# Patient Record
Sex: Female | Born: 1970 | Race: Black or African American | Hispanic: No | Marital: Married | Smoking: Current every day smoker
Health system: Southern US, Community
[De-identification: ages and names within clinical notes are randomized; demographics above are authoritative.]

## PROBLEM LIST (undated history)

## (undated) DIAGNOSIS — I10 Essential (primary) hypertension: Secondary | ICD-10-CM

---

## 2018-03-09 ENCOUNTER — Encounter (HOSPITAL_COMMUNITY): Payer: Self-pay | Admitting: *Deleted

## 2018-03-09 ENCOUNTER — Ambulatory Visit (HOSPITAL_COMMUNITY)
Admission: EM | Admit: 2018-03-09 | Discharge: 2018-03-09 | Disposition: A | Discharge status: Home / Self Care | Payer: Managed Care, Other (non HMO) | Attending: Internal Medicine | Admitting: Internal Medicine

## 2018-03-09 DIAGNOSIS — M62830 Muscle spasm of back: Secondary | ICD-10-CM

## 2018-03-09 DIAGNOSIS — I10 Essential (primary) hypertension: Secondary | ICD-10-CM

## 2018-03-09 HISTORY — DX: Essential (primary) hypertension: I10

## 2018-03-09 MED ORDER — CYCLOBENZAPRINE HCL 10 MG PO TABS: 10.0000 mg | ORAL_TABLET | Freq: Two times a day (BID) | ORAL | 0 refills | Status: DC | PRN

## 2018-03-09 MED ORDER — NAPROXEN 375 MG PO TABS: 375.0000 mg | ORAL_TABLET | Freq: Two times a day (BID) | ORAL | 0 refills | Status: DC | PRN

## 2018-03-09 NOTE — ED Provider Notes (Addendum)
[facility]    CSN: [id number] Arrival date & time: 03/09/18  1422     History   Chief Complaint Chief Complaint  Patient presents with  . Back Pain  . Abdominal Pain    HPI Alison Hopkins is a 47 y.o. female.   Complains of mid back pain that started three days ago.  It began after reaching over her seat in the car forward.  She localizes the pain to the left mid back and reports radiating symptoms towards left side.  She describes the pain as constant and cramping.  She has tried OTC medications including ibuprofen and goody powder without relief.  Her symptoms are made worse with bending forward and reaching activities.  She denies hx of similar symptoms in the past.    Negative family hx of heart disease, HTN or vascular dx Patient reports hx of HTN Smoker <0.5 PPD <20 years Negative hx of high cholesterol, or vascular disease     Past Medical History:  Diagnosis Date  . Hypertension     There are no active problems to display for this patient.   History reviewed. No pertinent surgical history.  OB History   None      Home Medications    Prior to Admission medications   Medication Sig Start Date End Date Taking? Authorizing Provider  LISINOPRIL-HYDROCHLOROTHIAZIDE PO Take by mouth.   Yes [provider]  cyclobenzaprine (FLEXERIL) 10 MG tablet Take 1 tablet (10 mg total) by mouth 2 (two) times daily as needed for muscle spasms. 03/09/18   Otniel Hoe, [place], PA-C  naproxen (NAPROSYN) 375 MG tablet Take 1 tablet (375 mg total) by mouth 2 (two) times daily as needed for moderate pain. 03/09/18   Rennis Harding, PA-C    Family History Family History  Problem Relation Age of Onset  . Healthy Mother   . Healthy Father     Social History Social History   Tobacco Use  . Smoking status: Current Every Day Smoker    Types: Cigarettes  . Smokeless tobacco: Never Used  Substance Use Topics  . Alcohol use: Yes    Comment: occasionally  .  Drug use: Never     Allergies   Patient has no known allergies.   Review of Systems Review of Systems  Constitutional: Negative for chills, fatigue and fever.  Respiratory: Negative for shortness of breath.   Cardiovascular: Negative for chest pain.  Gastrointestinal: Negative for abdominal pain, constipation, diarrhea, nausea and vomiting.  Genitourinary: Negative for difficulty urinating, dysuria, flank pain, frequency, hematuria and urgency.  Musculoskeletal: Positive for back pain and myalgias. Negative for gait problem.  Neurological: Negative for weakness.     Physical Exam Triage Vital Signs ED Triage Vitals  Enc Vitals Group     BP 03/09/18 1547 (!) 154/94     Pulse Rate 03/09/18 1547 81     Resp 03/09/18 1547 16     Temp 03/09/18 1547 (!) 97.5 F (36.4 C)     Temp src --      SpO2 03/09/18 1547 100 %     Weight --      Height --      Head Circumference --      Peak Flow --      Pain Score 03/09/18 1548 9     Pain Loc --      Pain Edu? --      Excl. in GC? --    No data found.  Updated Vital  Signs BP (!) 154/94   Pulse 81   Temp (!) 97.5 F (36.4 C)   Resp 16   LMP 02/16/2018 (Approximate)   SpO2 100%    Physical Exam  Constitutional: She is oriented to person, place, and time. She appears well-developed and well-nourished.  Non-toxic appearance. She does not appear ill. No distress.  HENT:  Head: Normocephalic and atraumatic.  Mouth/Throat: Oropharynx is clear and moist. No oropharyngeal exudate.  Eyes: Pupils are equal, round, and reactive to light. EOM are normal. No scleral icterus.  Neck: Normal range of motion.  Cardiovascular: Normal rate, regular rhythm, normal heart sounds and intact distal pulses. Exam reveals no gallop.  No murmur heard. 2+ radial and posterior tibialis pulse bilaterally and symmetrical  Pulmonary/Chest: Effort normal and breath sounds normal. She has no wheezes. She has no rhonchi. She has no rales.  Abdominal: Soft.  Normal appearance, normal aorta and bowel sounds are normal. She exhibits no pulsatile midline mass. There is no guarding and no CVA tenderness.  Musculoskeletal: She exhibits tenderness.  Back: Inspection: Skin clear and intact with obvious erythema, effusion, or ecchymosis.  Skin warm and dry to the touch Palpation: Nontender to palpation over the vertebral processes.  Mild tenderness of the left paravertebral muscles of the thoracic spine ROM: FROM active and passive Strength: 5/5 elbow extension, 5/5 grip strength, 5/5 hip flexion, 5/5 knee abduction, 5/5 knee adduction, 5/5 knee flexion, 5/5 knee extension, 5/5 dorsiflexion, 5/5 plantar flexion Sensation intact about the upper and lower extremities  Lymphadenopathy:    She has no cervical adenopathy.  Neurological: She is alert and oriented to person, place, and time. She displays normal reflexes (2+ patellar reflex). Coordination (Rapid alternating movements without diffculty) normal.  Skin: Skin is warm and dry. Capillary refill takes less than 2 seconds.  Psychiatric: She has a normal mood and affect. Her behavior is normal. Judgment and thought content normal.  Vitals reviewed.    UC Treatments / Results  Labs (all labs ordered are listed, but only abnormal results are displayed) Labs Reviewed - No data to display  EKG None Radiology No results found.  Procedures Procedures (including critical care time)  Medications Ordered in UC Medications - No data to display   Initial Impression / Assessment and Plan / UC Course  I have reviewed the triage vital signs and the nursing notes.  Pertinent labs & imaging results that were available during my care of the patient were reviewed by me and considered in my medical decision making (see chart for details).    Three day hx of back pain that began after reaching motion.  Based on hx and PE symptoms most consistent with muscle spasm.  Will perform gentle stretches, rest, ice  and heat as directed.  Prescribed naproxen and cyclobenzaprine as needed for pain and muscle spasm.  Will follow up with PCP if symptoms persists.  Return and ER precautions given.     Final Clinical Impressions(s) / UC Diagnoses   Final diagnoses:  Muscle spasm of back  Essential hypertension, benign    ED Discharge Orders        Ordered    naproxen (NAPROSYN) 375 MG tablet  2 times daily PRN     03/09/18 1620    cyclobenzaprine (FLEXERIL) 10 MG tablet  2 times daily PRN     03/09/18 1620       Controlled Substance Prescriptions [place] Controlled Substance Registry consulted? No   Rennis Harding, [place] 03/09/18 1630  Rennis Harding, PA-C 03/09/18 1631

## 2018-03-09 NOTE — Discharge Instructions (Addendum)
Rest, ice, heat and gentle stretches Prescribed naproxen take as needed for symptomatic relief Prescribed flexeril as needed for muscle spasm (do not drive or operate heavy machinery while taking this medication, it may make you drowsy) Follow up with PCP if symptoms persists Return here or go to ER if you have any new or worsening symptoms  Take blood pressure medication as directed Follow up with PCP if blood pressure continues to remain elevated

## 2018-03-09 NOTE — ED Triage Notes (Signed)
Pt reports reaching over her seat in the care when sudden onset left mid-back pain wrapping around to epigastric region.  Continues with significant pain.

## 2018-04-08 ENCOUNTER — Emergency Department (HOSPITAL_COMMUNITY)
Admission: EM | Admit: 2018-04-08 | Discharge: 2018-04-08 | Disposition: A | Discharge status: Home / Self Care | Payer: 59 | Attending: Emergency Medicine | Admitting: Emergency Medicine

## 2018-04-08 ENCOUNTER — Emergency Department (HOSPITAL_COMMUNITY): Payer: 59

## 2018-04-08 DIAGNOSIS — Z5321 Procedure and treatment not carried out due to patient leaving prior to being seen by health care provider: Secondary | ICD-10-CM | POA: Diagnosis not present

## 2018-04-08 DIAGNOSIS — R0789 Other chest pain: Secondary | ICD-10-CM | POA: Insufficient documentation

## 2018-04-08 LAB — BASIC METABOLIC PANEL
Anion gap: 8 (ref 5–15)
BUN: 9 mg/dL (ref 6–20)
CALCIUM: 9.5 mg/dL (ref 8.9–10.3)
CO2: 26 mmol/L (ref 22–32)
CREATININE: 0.69 mg/dL (ref 0.44–1.00)
Chloride: 104 mmol/L (ref 101–111)
GFR calc Af Amer: >60 mL/min (ref >60)
GFR calc non Af Amer: >60 mL/min (ref >60)
Glucose, Bld: 109 mg/dL — ABNORMAL HIGH (ref 65–99)
Potassium: 4.1 mmol/L (ref 3.5–5.1)
SODIUM: 138 mmol/L (ref 135–145)

## 2018-04-08 LAB — CBC
HCT: 37.8 % (ref 36.0–46.0)
Hemoglobin: 11.7 g/dL — ABNORMAL LOW (ref 12.0–15.0)
MCH: 28.3 pg (ref 26.0–34.0)
MCHC: 31 g/dL (ref 30.0–36.0)
MCV: 91.3 fL (ref 78.0–100.0)
PLATELETS: 429 10*3/uL — AB (ref 150–400)
RBC: 4.14 MIL/uL (ref 3.87–5.11)
RDW: 12.2 % (ref 11.5–15.5)
WBC: 4.7 10*3/uL (ref 4.0–10.5)

## 2018-04-08 LAB — I-STAT TROPONIN, ED: TROPONIN I, POC: 0 ng/mL (ref 0.00–0.08)

## 2018-04-08 LAB — I-STAT BETA HCG BLOOD, ED (MC, WL, AP ONLY): I-stat hCG, quantitative: <5 m[IU]/mL (ref <5)

## 2018-04-08 NOTE — ED Notes (Signed)
Patient noted to be walking up stairs outside of front entrance while this RN was assisting another patient into their vehicle, stating "let's go, I'm not waiting here this long for that." Attempted to stop patient and talk to them, but patient had already walked off. Ambulatory with steady gait, no apparent distress noted.

## 2018-04-09 NOTE — ED Notes (Signed)
Follow up call made  Pt states she is returning to ed when she gets off work  1225  04/09/18  s Leodan Bolyard rn

## 2018-07-22 ENCOUNTER — Encounter (HOSPITAL_COMMUNITY): Payer: Self-pay | Admitting: Emergency Medicine

## 2018-07-22 ENCOUNTER — Emergency Department (HOSPITAL_COMMUNITY): Admission: EM | Admit: 2018-07-22 | Payer: 59 | Source: Home / Self Care

## 2018-07-22 ENCOUNTER — Ambulatory Visit (HOSPITAL_COMMUNITY)
Admission: EM | Admit: 2018-07-22 | Discharge: 2018-07-22 | Disposition: A | Discharge status: Home / Self Care | Payer: Managed Care, Other (non HMO) | Attending: Family Medicine | Admitting: Family Medicine

## 2018-07-22 ENCOUNTER — Other Ambulatory Visit: Payer: Self-pay

## 2018-07-22 DIAGNOSIS — R1032 Left lower quadrant pain: Secondary | ICD-10-CM

## 2018-07-22 DIAGNOSIS — R109 Unspecified abdominal pain: Secondary | ICD-10-CM

## 2018-07-22 LAB — POCT PREGNANCY, URINE: PREG TEST UR: NEGATIVE

## 2018-07-22 LAB — POCT URINALYSIS DIP (DEVICE)
Bilirubin Urine: NEGATIVE
GLUCOSE, UA: NEGATIVE mg/dL
Hgb urine dipstick: NEGATIVE
Nitrite: NEGATIVE
PROTEIN: NEGATIVE mg/dL
Specific Gravity, Urine: >=1.03 (ref 1.005–1.030)
UROBILINOGEN UA: 1 mg/dL (ref 0.0–1.0)
pH: 6 (ref 5.0–8.0)

## 2018-07-22 MED ORDER — MELOXICAM 7.5 MG PO TABS: 7.5000 mg | ORAL_TABLET | Freq: Every day | ORAL | 0 refills | Status: DC

## 2018-07-22 NOTE — ED Triage Notes (Signed)
Pt c/o pelvic pain x2 days, [facility] and has been taking antibiotics for UTi but its still hurting.

## 2018-07-22 NOTE — ED Provider Notes (Signed)
[facility]   [id number] 07/22/18 Arrival Time: 1800  CC: Left groin and low back pain  SUBJECTIVE:  Alison Hopkins is a 47 y.o. female who presents with complaint of worsening left groin and back pain that started 4 days ago.  Was seen at [facility] 3 days ago and treated with antibiotic for UTI.  Denies a precipitating event, or trauma.  Localizes pain to left groin and back.  Describes as intermittent and throbbing in character.  Has tried OTC medications like aleve and ibuprofen with relief.  Is compliant with antibiotic, but denies relief in her symptoms.  Improved with laying on right side. Denies similar symptoms in the past.  Last BM this morning and normal for pain.  Complains of associated urinary frequency.     Denies fever, chills, appetite changes, weight changes, nausea, vomiting, chest pain, SOB, diarrhea, constipation, hematochezia, melena, dysuria, difficulty urinating, increased urgency, flank pain, loss of bowel or bladder function, saddle paresthesia, vaginal pain, vaginal bleeding.    Patient's last menstrual period was 07/02/2018.  ROS: As per HPI.  Past Medical History:  Diagnosis Date  . Hypertension    History reviewed. No pertinent surgical history. No Known Allergies No current facility-administered medications on file prior to encounter.    Current Outpatient Medications on File Prior to Encounter  Medication Sig Dispense Refill  . Sulfamethoxazole-Trimethoprim (BACTRIM PO) Take by mouth.    . cyclobenzaprine (FLEXERIL) 10 MG tablet Take 1 tablet (10 mg total) by mouth 2 (two) times daily as needed for muscle spasms. 20 tablet 0  . LISINOPRIL-HYDROCHLOROTHIAZIDE PO Take by mouth.    . sulfamethoxazole-trimethoprim (BACTRIM DS,SEPTRA DS) 800-160 MG tablet      Social History   Socioeconomic History  . Marital status: Married    Spouse name: Not on file  . Number of children: Not on file  . Years of education: Not on file  . Highest education  level: Not on file  Occupational History  . Not on file  Social Needs  . Financial resource strain: Not on file  . Food insecurity:    Worry: Not on file    Inability: Not on file  . Transportation needs:    Medical: Not on file    Non-medical: Not on file  Tobacco Use  . Smoking status: Current Every Day Smoker    Types: Cigarettes  . Smokeless tobacco: Never Used  Substance and Sexual Activity  . Alcohol use: Yes    Comment: occasionally  . Drug use: Never  . Sexual activity: Not on file  Lifestyle  . Physical activity:    Days per week: Not on file    Minutes per session: Not on file  . Stress: Not on file  Relationships  . Social connections:    Talks on phone: Not on file    Gets together: Not on file    Attends religious service: Not on file    Active member of club or organization: Not on file    Attends meetings of clubs or organizations: Not on file    Relationship status: Not on file  . Intimate partner violence:    Fear of current or ex partner: Not on file    Emotionally abused: Not on file    Physically abused: Not on file    Forced sexual activity: Not on file  Other Topics Concern  . Not on file  Social History Narrative  . Not on file   Family History  Problem Relation  Age of Onset  . Healthy Mother   . Healthy Father      OBJECTIVE:  Vitals:   07/22/18 1842  BP: (!) 140/92  Pulse: 70  Resp: 16  Temp: 98.1 F (36.7 C)  SpO2: 100%    General appearance: AOx3 in no acute distress; nontoxic appearance HEENT: NCAT.  Oropharynx clear.  Lungs: clear to auscultation bilaterally without adventitious breath sounds Heart: regular rate and rhythm.  Radial pulses 2+ symmetrical bilaterally Abdomen: soft, non-distended; normal active bowel sounds; non-tender to light and deep palpation; no guarding Back: no CVA tenderness; no midline tenderness or palpable muscle spasm; unable to reproduce symptoms on exam Extremities: no edema; symmetrical with  no gross deformities Skin: warm and dry Neurologic: normal gait Psychological: alert and cooperative; normal mood and affect  Labs: Results for orders placed or performed during the hospital encounter of 07/22/18 (from the past 24 hour(s))  POCT urinalysis dip (device)     Status: Abnormal   Collection Time: 07/22/18  7:02 PM  Result Value Ref Range   Glucose, UA NEGATIVE NEGATIVE mg/dL   Bilirubin Urine NEGATIVE NEGATIVE   Ketones, ur TRACE (A) NEGATIVE mg/dL   Specific Gravity, Urine >=1.030 1.005 - 1.030   Hgb urine dipstick NEGATIVE NEGATIVE   pH 6.0 5.0 - 8.0   Protein, ur NEGATIVE NEGATIVE mg/dL   Urobilinogen, UA 1.0 0.0 - 1.0 mg/dL   Nitrite NEGATIVE NEGATIVE   Leukocytes, UA SMALL (A) NEGATIVE  Pregnancy, urine POC     Status: None   Collection Time: 07/22/18  7:12 PM  Result Value Ref Range   Preg Test, Ur NEGATIVE NEGATIVE   ASSESSMENT & PLAN:  1. Acute left flank pain   2. Left groin pain     Meds ordered this encounter  Medications  . meloxicam (MOBIC) 7.5 MG tablet    Sig: Take 1 tablet (7.5 mg total) by mouth daily.    Dispense:  20 tablet    Refill:  0    Order Specific Question:   Supervising Provider    Answer:   Isa Rankin [[id number]]   Continue with antibiotic Symptoms could be related to kidney stones Drink plenty of fluids and get rest Mobic prescribed.  Take as directed and to completion.  Do not take with other NSAIDs as this can lead to stomach upset, ulcer formation, and GI bleed Follow up with PCP if symptoms persist Return or go to ER if you have any new or worsening symptoms (difficulty urinating, blood in urine, pain that does not moderate with medication, fever, chills, abdominal pain, etc...)  Reviewed expectations re: course of current medical issues. Questions answered. Outlined signs and symptoms indicating need for more acute intervention. Patient verbalized understanding. After Visit Summary given.   Rennis Harding,  PA-C 07/22/18 2012

## 2018-07-22 NOTE — Discharge Instructions (Addendum)
Symptoms could be related to kidney stones Drink plenty of fluids and get rest Mobic prescribed.  Take as directed and to completion.  Do not take with other NSAIDs as this can lead to stomach upset, ulcer formation, and GI bleed Follow up with PCP if symptoms persist Return or go to ER if you have any new or worsening symptoms (difficulty urinating, blood in urine, pain that does not moderate with medication, fever, chills, abdominal pain, etc...)

## 2019-11-14 IMAGING — DX DG CHEST 2V
2 series · 2 of 2 positions shown · non-contrast
Comparison: None.

CLINICAL DATA: 46 y/o  F; chest pain.

EXAM:
CHEST - 2 VIEW

[w chest pa]
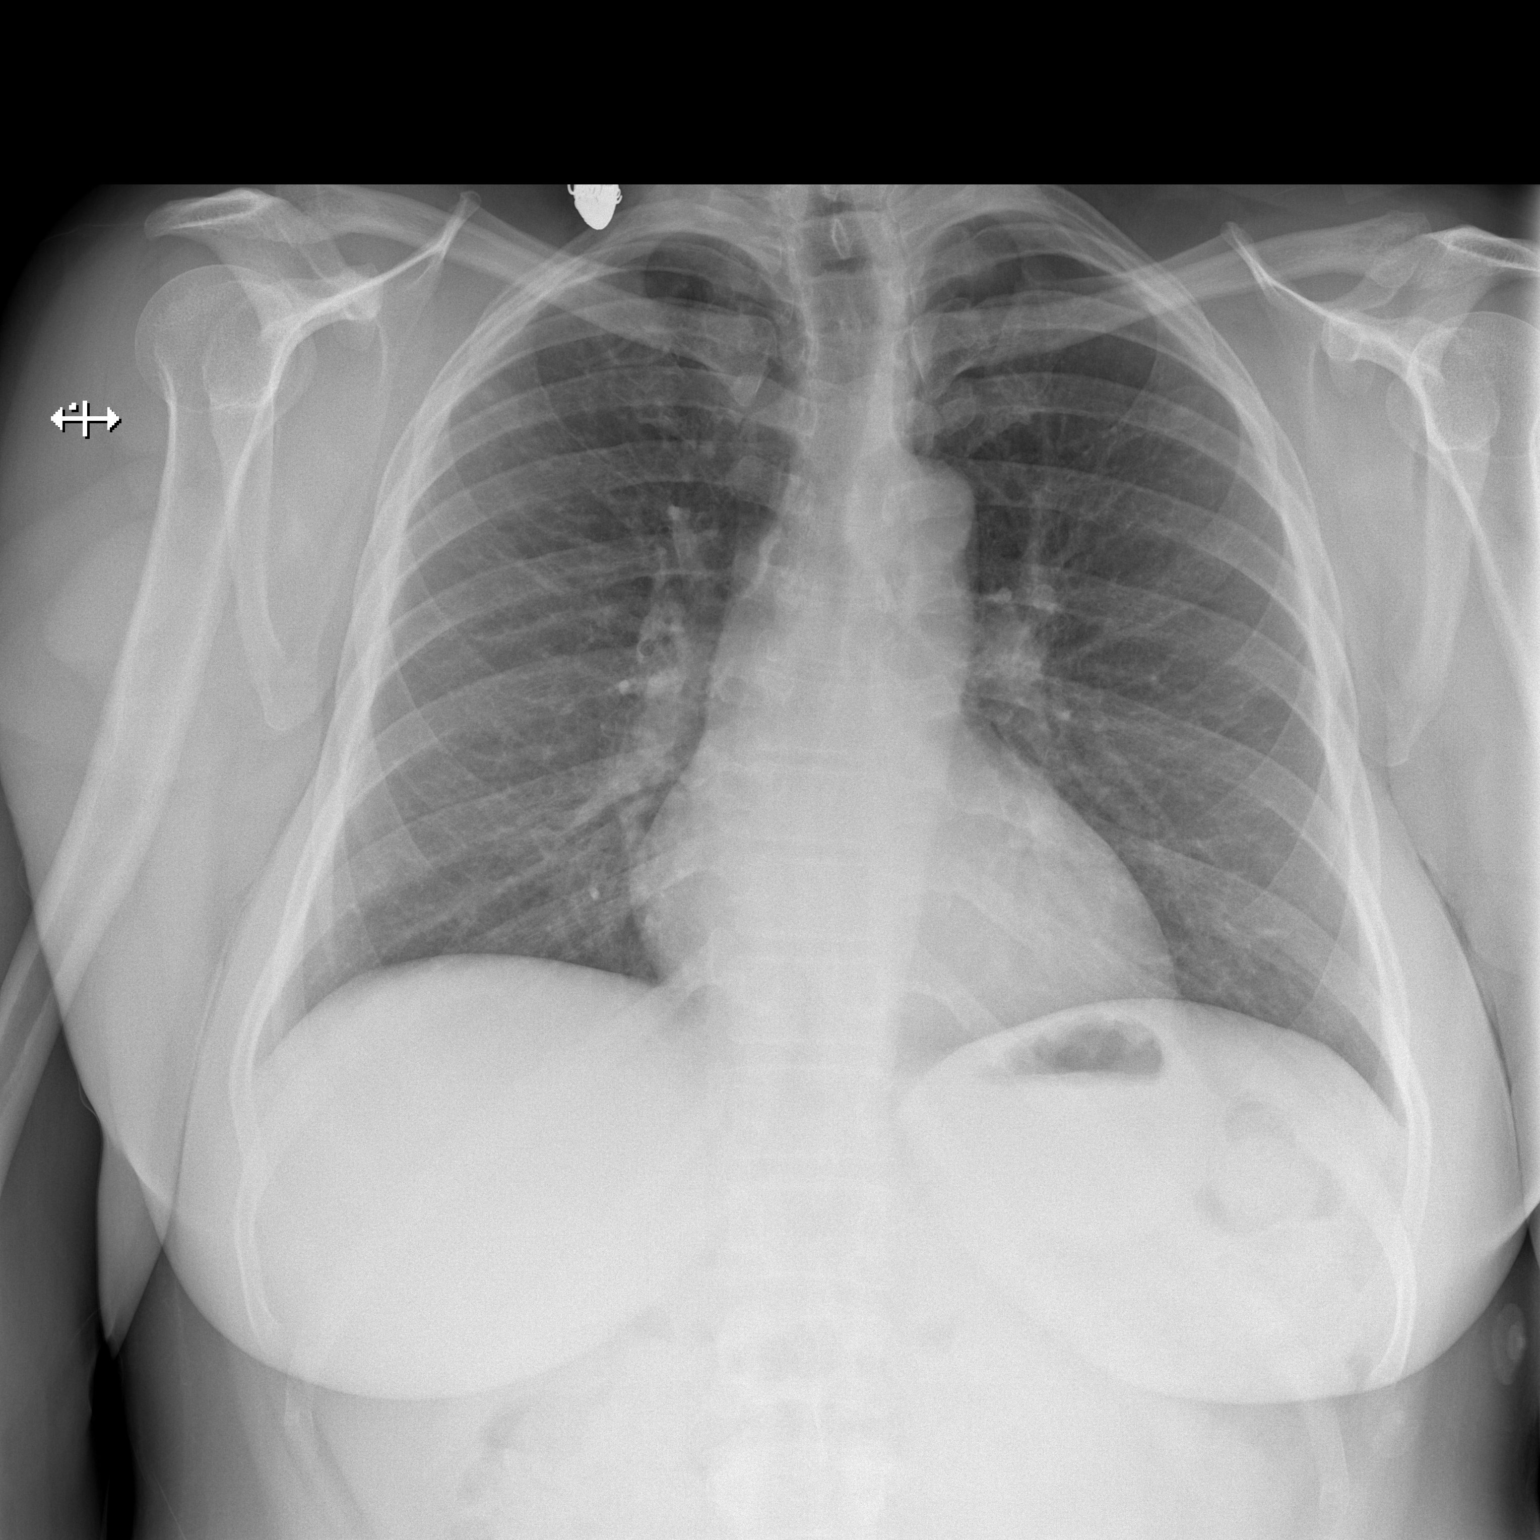

[w chest lat]
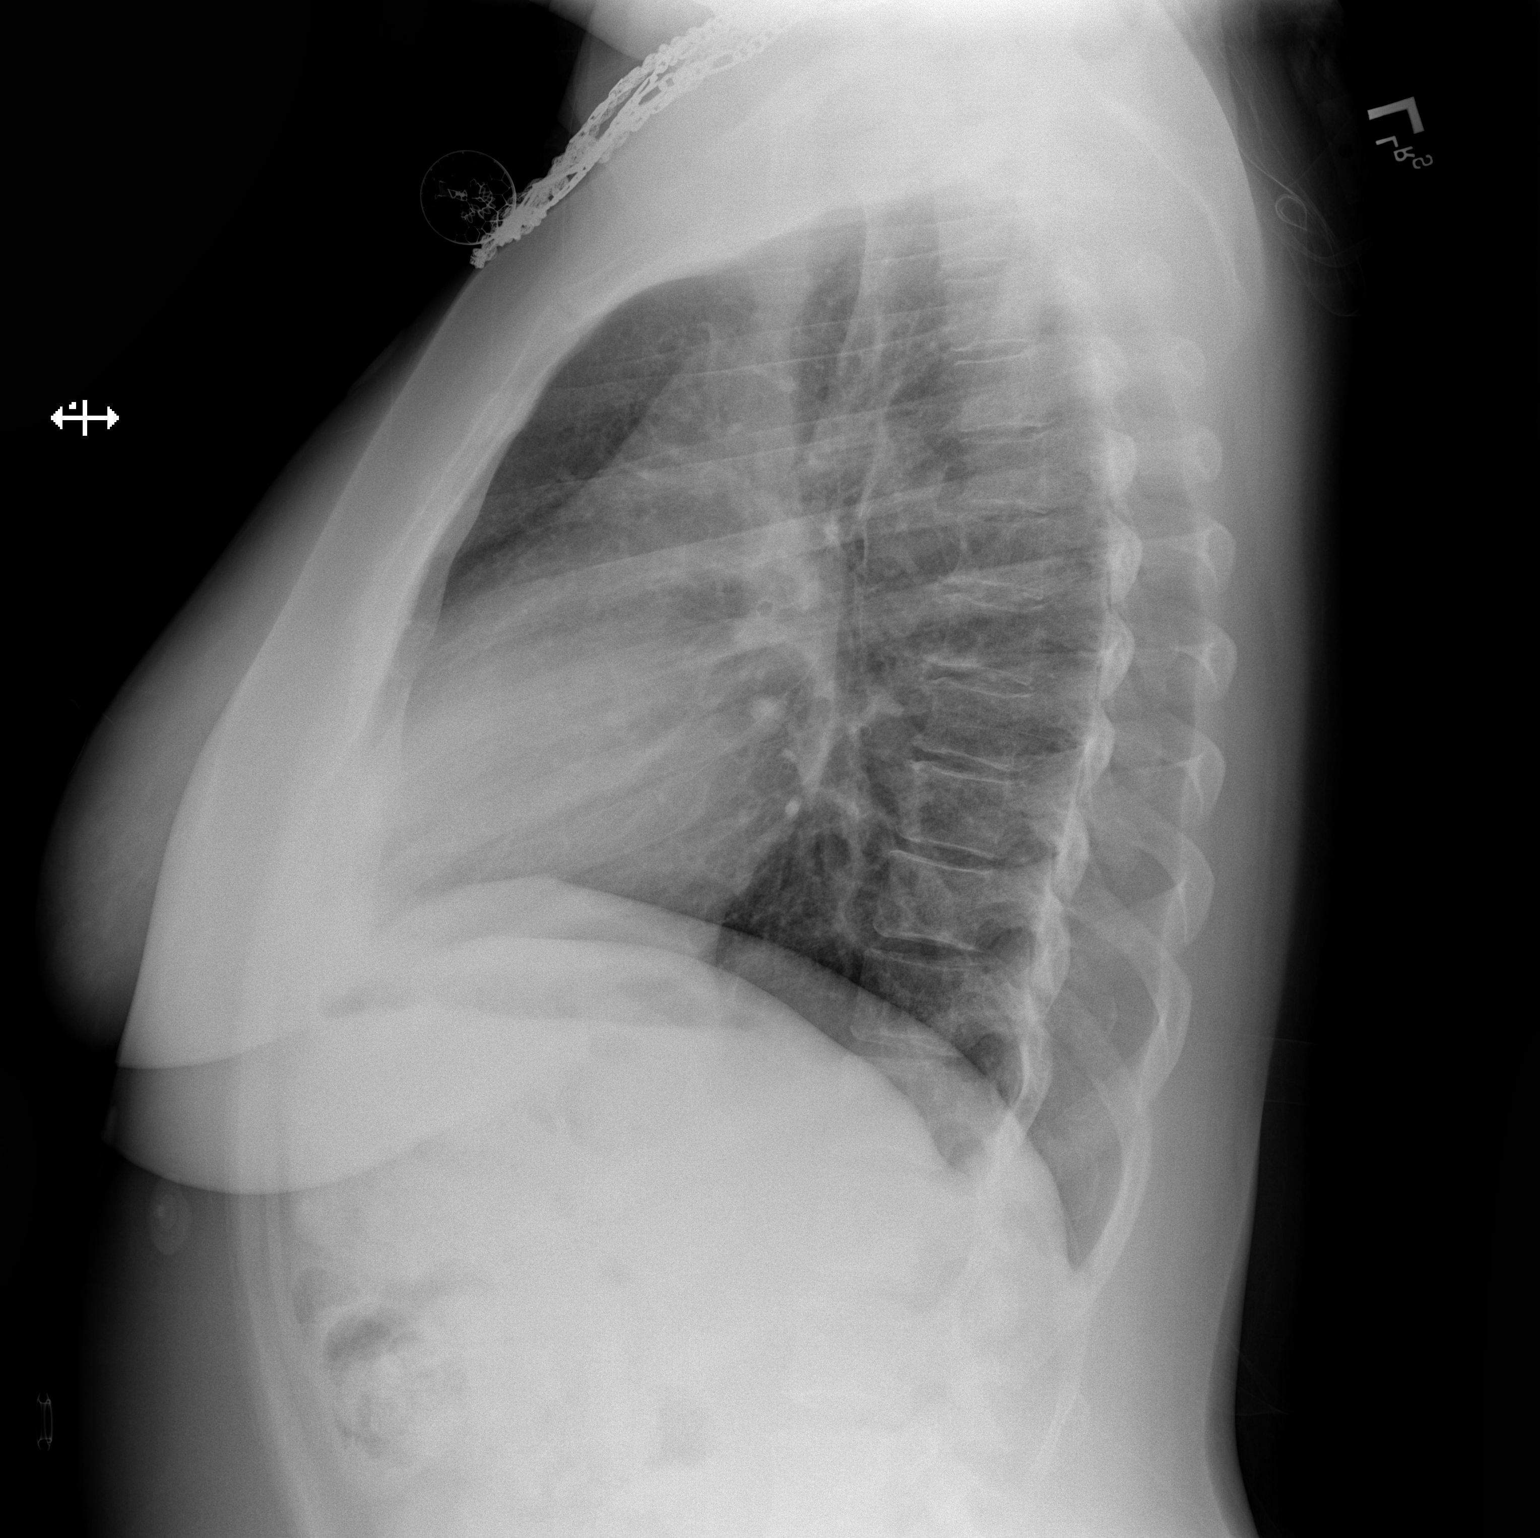

[2 of 2 positions shown; findings below may reference images not displayed]

FINDINGS: The heart size and mediastinal contours are within normal limits.
Both lungs are clear. The visualized skeletal structures are
unremarkable.
IMPRESSION: No active cardiopulmonary disease.

By: Dorisana Une M.D.

## 2023-01-23 ENCOUNTER — Ambulatory Visit (INDEPENDENT_AMBULATORY_CARE_PROVIDER_SITE_OTHER): Payer: 59

## 2023-01-23 ENCOUNTER — Encounter (HOSPITAL_COMMUNITY): Payer: Self-pay

## 2023-01-23 ENCOUNTER — Ambulatory Visit (HOSPITAL_COMMUNITY)
Admission: EM | Admit: 2023-01-23 | Discharge: 2023-01-23 | Disposition: A | Discharge status: Home / Self Care | Payer: 59 | Attending: Family Medicine | Admitting: Family Medicine

## 2023-01-23 DIAGNOSIS — M25571 Pain in right ankle and joints of right foot: Secondary | ICD-10-CM

## 2023-01-23 DIAGNOSIS — W19XXXA Unspecified fall, initial encounter: Secondary | ICD-10-CM

## 2023-01-23 DIAGNOSIS — M79671 Pain in right foot: Secondary | ICD-10-CM

## 2023-01-23 MED ORDER — NAPROXEN 500 MG PO TABS: 500.0000 mg | ORAL_TABLET | Freq: Two times a day (BID) | ORAL | 0 refills | Status: AC

## 2023-01-23 MED ORDER — HYDROCODONE-ACETAMINOPHEN 5-325 MG PO TABS: 1.0000 | ORAL_TABLET | Freq: Four times a day (QID) | ORAL | 0 refills | Status: AC | PRN

## 2023-01-23 NOTE — Discharge Instructions (Signed)
Be aware, you have been prescribed pain medications that may cause drowsiness. While taking this medication, do not take any other medications containing acetaminophen (Tylenol). Do not combine with alcohol or or recreational drugs. Please do not drive, operate heavy machinery, or take part in activities that require making important decisions while on this medication as your judgement may be clouded.

## 2023-01-23 NOTE — ED Triage Notes (Signed)
Pt fell today and injured right ankle. Pt did not take anything for the pain today.

## 2023-01-24 NOTE — ED Provider Notes (Signed)
[place]   [id number] 01/23/23 Arrival Time: 1923  ASSESSMENT & PLAN:  1. Right foot pain   2. Acute right ankle pain    I have personally viewed and independently interpreted the imaging studies ordered this visit. R ankle and R foot: no appreciable acute bony abnormalities.  Discharge Medication List as of 01/23/2023  8:32 PM     START taking these medications   Details  HYDROcodone-acetaminophen (NORCO/VICODIN) 5-325 MG tablet Take 1 tablet by mouth every 6 (six) hours as needed for moderate pain or severe pain., Starting Wed 01/23/2023, Print    naproxen (NAPROSYN) 500 MG tablet Take 1 tablet (500 mg total) by mouth 2 (two) times daily with a meal., Starting Wed 01/23/2023, Print        Orders Placed This Encounter  Procedures   DG Ankle Complete Right   DG Foot Complete Right   Apply CAM boot   Crutches   Work/school excuse note: provided. Recommend:  Follow-up Information     Schedule an appointment as soon as possible for a visit  with Luis Llorens Torres.   Contact information: [place] [address] [phone]               [place] Controlled Substances Registry consulted for this patient. I feel the risk/benefit ratio today is favorable for proceeding with this prescription for a controlled substance. Medication sedation precautions given.  Reviewed expectations re: course of current medical issues. Questions answered. Outlined signs and symptoms indicating need for more acute intervention. Patient verbalized understanding. After Visit Summary given.  SUBJECTIVE: History from: patient. Alison Hopkins is a 52 y.o. female who reports tripping today with resulting RIGHT ankle/foot pain; unable to bear weight; injury 1 hour ago. No tx PTA. No extremity sensation changes or weakness.   History reviewed. No pertinent surgical history.    OBJECTIVE:  Vitals:   01/23/23 1957  BP: (!)  148/104  Pulse: 90  Resp: 16  Temp: 98.4 F (36.9 C)  TempSrc: Oral  SpO2: 96%    General appearance: alert; no distress HEENT: ; AT Neck: supple with FROM Resp: unlabored respirations Extremities: RLE: warm with well perfused appearance; poorly localized marked tenderness over right proximal dorsal foot and lateral ankle; without gross deformities; swelling: moderate; bruising: none; ankle ROM: limited by reported pain; moving all toes normally CV: brisk extremity capillary refill of RLE; 2+ DP pulse of RLE. Skin: warm and dry; no visible rashes Neurologic: does not attempt to bear weight here; normal sensation and strength of RLE Psychological: alert and cooperative; normal mood and affect  Imaging: DG Ankle Complete Right  Result Date: 01/23/2023 CLINICAL DATA:  Foot and ankle injury after fall EXAM: RIGHT ANKLE - COMPLETE 3+ VIEW; RIGHT FOOT COMPLETE - 3+ VIEW COMPARISON:  None Available. FINDINGS: There is no evidence of fracture, dislocation, or joint effusion. There is no evidence of arthropathy or other focal bone abnormality. Soft tissues are unremarkable. IMPRESSION: Negative. Electronically Signed   By: Placido Sou M.D.   On: 01/23/2023 20:24   DG Foot Complete Right  Result Date: 01/23/2023 CLINICAL DATA:  Foot and ankle injury after fall EXAM: RIGHT ANKLE - COMPLETE 3+ VIEW; RIGHT FOOT COMPLETE - 3+ VIEW COMPARISON:  None Available. FINDINGS: There is no evidence of fracture, dislocation, or joint effusion. There is no evidence of arthropathy or other focal bone abnormality. Soft tissues are unremarkable. IMPRESSION: Negative. Electronically Signed   By: Dorothea Ogle  Stutzman M.D.   On: 01/23/2023 20:24      No Known Allergies  Past Medical History:  Diagnosis Date   Hypertension    Social History   Socioeconomic History   Marital status: Married    Spouse name: Not on file   Number of children: Not on file   Years of education: Not on file   Highest education  level: Not on file  Occupational History   Not on file  Tobacco Use   Smoking status: Every Day    Types: Cigarettes   Smokeless tobacco: Never  Vaping Use   Vaping Use: Never used  Substance and Sexual Activity   Alcohol use: Yes    Comment: occasionally   Drug use: Never   Sexual activity: Not on file  Other Topics Concern   Not on file  Social History Narrative   Not on file   Social Determinants of Health   Financial Resource Strain: Not on file  Food Insecurity: Not on file  Transportation Needs: Not on file  Physical Activity: Not on file  Stress: Not on file  Social Connections: Not on file   Family History  Problem Relation Age of Onset   Healthy Mother    Healthy Father    History reviewed. No pertinent surgical history.     Vanessa Kick, MD 01/24/23 [phone]

## 2023-08-29 ENCOUNTER — Ambulatory Visit
Admission: RE | Admit: 2023-08-29 | Discharge: 2023-08-29 | Disposition: A | Discharge status: Home / Self Care | Payer: 59 | Source: Ambulatory Visit | Attending: Infectious Diseases | Admitting: Infectious Diseases

## 2023-08-29 ENCOUNTER — Other Ambulatory Visit: Payer: Self-pay | Admitting: Infectious Diseases

## 2023-08-29 DIAGNOSIS — R7612 Nonspecific reaction to cell mediated immunity measurement of gamma interferon antigen response without active tuberculosis: Secondary | ICD-10-CM
